# Patient Record
Sex: Female | Born: 1990 | Hispanic: No | Marital: Married | Smoking: Never smoker
Health system: Southern US, Community
[De-identification: ages and names within clinical notes are randomized; demographics above are authoritative.]

---

## 2017-11-06 ENCOUNTER — Other Ambulatory Visit (HOSPITAL_COMMUNITY): Payer: Self-pay | Admitting: Obstetrics & Gynecology

## 2017-11-06 ENCOUNTER — Ambulatory Visit (HOSPITAL_COMMUNITY)
Admission: RE | Admit: 2017-11-06 | Discharge: 2017-11-06 | Disposition: A | Discharge status: Home / Self Care | Payer: 59 | Source: Ambulatory Visit | Attending: Obstetrics & Gynecology | Admitting: Obstetrics & Gynecology

## 2017-11-06 DIAGNOSIS — N83291 Other ovarian cyst, right side: Secondary | ICD-10-CM | POA: Diagnosis not present

## 2017-11-06 DIAGNOSIS — O00119 Unspecified tubal pregnancy with intrauterine pregnancy: Secondary | ICD-10-CM

## 2017-11-06 DIAGNOSIS — N83292 Other ovarian cyst, left side: Secondary | ICD-10-CM | POA: Insufficient documentation

## 2017-11-06 DIAGNOSIS — Z3A01 Less than 8 weeks gestation of pregnancy: Secondary | ICD-10-CM | POA: Insufficient documentation

## 2017-11-06 DIAGNOSIS — O3680X0 Pregnancy with inconclusive fetal viability, not applicable or unspecified: Secondary | ICD-10-CM | POA: Diagnosis present

## 2017-11-06 DIAGNOSIS — O3481 Maternal care for other abnormalities of pelvic organs, first trimester: Secondary | ICD-10-CM | POA: Diagnosis not present

## 2017-12-15 LAB — OB RESULTS CONSOLE ABO/RH: RH Type: POSITIVE

## 2017-12-15 LAB — OB RESULTS CONSOLE RPR: RPR: NONREACTIVE

## 2017-12-15 LAB — OB RESULTS CONSOLE GC/CHLAMYDIA
CHLAMYDIA, DNA PROBE: NEGATIVE
GC PROBE AMP, GENITAL: NEGATIVE

## 2017-12-15 LAB — OB RESULTS CONSOLE RUBELLA ANTIBODY, IGM: Rubella: IMMUNE

## 2017-12-15 LAB — OB RESULTS CONSOLE HIV ANTIBODY (ROUTINE TESTING): HIV: NONREACTIVE

## 2017-12-15 LAB — OB RESULTS CONSOLE ANTIBODY SCREEN: ANTIBODY SCREEN: NEGATIVE

## 2017-12-15 LAB — OB RESULTS CONSOLE HEPATITIS B SURFACE ANTIGEN: HEP B S AG: NEGATIVE

## 2018-06-01 ENCOUNTER — Encounter (HOSPITAL_COMMUNITY): Payer: Self-pay | Admitting: *Deleted

## 2018-06-02 ENCOUNTER — Other Ambulatory Visit: Payer: Self-pay | Admitting: Obstetrics & Gynecology

## 2018-06-07 ENCOUNTER — Encounter (HOSPITAL_COMMUNITY): Payer: Self-pay

## 2018-06-14 NOTE — Patient Instructions (Signed)
Heather Oliver  06/14/2018   Your procedure is scheduled on:  06/21/2018  Enter through the Main Entrance of [facility] at 1130 AM.  Pick up the phone at the desk and dial [phone]  Call this number if you have problems the morning of surgery:[phone]  Remember:   Do not eat food:(After Midnight) Desps de medianoche.  Do not drink clear liquids: (After Midnight) Desps de medianoche.  Take these medicines the morning of surgery with A SIP OF WATER: none   Do not wear jewelry, make-up or nail polish.  Do not wear lotions, powders, or perfumes. Do not wear deodorant.  Do not shave 48 hours prior to surgery.  Do not bring valuables to the hospital.  [facility] is not   responsible for any belongings or valuables brought to the hospital.  Contacts, dentures or bridgework may not be worn into surgery.  Leave suitcase in the car. After surgery it may be brought to your room.  For patients admitted to the hospital, checkout time is 11:00 AM the day of              discharge.    N/A   Please read over the following fact sheets that you were given:   Surgical Site Infection Prevention

## 2018-06-18 ENCOUNTER — Encounter (HOSPITAL_COMMUNITY)
Admission: RE | Admit: 2018-06-18 | Discharge: 2018-06-18 | Disposition: A | Discharge status: Home / Self Care | Payer: 59 | Source: Ambulatory Visit | Attending: Obstetrics & Gynecology | Admitting: Obstetrics & Gynecology

## 2018-06-21 ENCOUNTER — Inpatient Hospital Stay (HOSPITAL_COMMUNITY): Admission: RE | Admit: 2018-06-21 | Payer: 59 | Source: Ambulatory Visit | Admitting: Obstetrics & Gynecology

## 2018-06-21 ENCOUNTER — Encounter (HOSPITAL_COMMUNITY): Admission: RE | Payer: Self-pay | Source: Ambulatory Visit

## 2018-06-21 SURGERY — Surgical Case
Anesthesia: Spinal

## 2018-06-29 ENCOUNTER — Other Ambulatory Visit: Payer: Self-pay | Admitting: Obstetrics and Gynecology

## 2018-06-29 ENCOUNTER — Telehealth (HOSPITAL_COMMUNITY): Payer: Self-pay | Admitting: *Deleted

## 2018-06-29 NOTE — Telephone Encounter (Signed)
Preadmission screen  

## 2018-06-29 NOTE — Patient Instructions (Signed)
Heather Oliver  06/29/2018   Your procedure is scheduled on:  07/01/2018  Enter through the Main Entrance of [facility] at 1100 AM.  Pick up the phone at the desk and dial [phone]  Call this number if you have problems the morning of surgery:[phone]  Remember:   Do not eat food:(After Midnight) Desps de medianoche.  Do not drink clear liquids: (After Midnight) Desps de medianoche.  Take these medicines the morning of surgery with A SIP OF WATER: none   Do not wear jewelry, make-up or nail polish.  Do not wear lotions, powders, or perfumes. Do not wear deodorant.  Do not shave 48 hours prior to surgery.  Do not bring valuables to the hospital.  [facility] is not   responsible for any belongings or valuables brought to the hospital.  Contacts, dentures or bridgework may not be worn into surgery.  Leave suitcase in the car. After surgery it may be brought to your room.  For patients admitted to the hospital, checkout time is 11:00 AM the day of              discharge.    N/A   Please read over the following fact sheets that you were given:   Surgical Site Infection Prevention

## 2018-06-30 ENCOUNTER — Encounter (HOSPITAL_COMMUNITY)
Admission: RE | Admit: 2018-06-30 | Discharge: 2018-06-30 | Disposition: A | Discharge status: Home / Self Care | Payer: 59 | Source: Ambulatory Visit | Attending: Obstetrics and Gynecology | Admitting: Obstetrics and Gynecology

## 2018-06-30 LAB — CBC
HEMATOCRIT: 40.4 % (ref 36.0–46.0)
Hemoglobin: 13 g/dL (ref 12.0–15.0)
MCH: 29 pg (ref 26.0–34.0)
MCHC: 32.2 g/dL (ref 30.0–36.0)
MCV: 90 fL (ref 80.0–100.0)
Platelets: 308 10*3/uL (ref 150–400)
RBC: 4.49 MIL/uL (ref 3.87–5.11)
RDW: 14.7 % (ref 11.5–15.5)
WBC: 10.2 10*3/uL (ref 4.0–10.5)
nRBC: 0 % (ref 0.0–0.2)

## 2018-06-30 LAB — TYPE AND SCREEN
ABO/RH(D): A POS
ANTIBODY SCREEN: NEGATIVE

## 2018-06-30 LAB — ABO/RH: ABO/RH(D): A POS

## 2018-07-01 ENCOUNTER — Encounter (HOSPITAL_COMMUNITY)
Admission: RE | Disposition: A | Discharge status: Home / Self Care | Payer: Self-pay | Source: Home / Self Care | Attending: Obstetrics and Gynecology

## 2018-07-01 ENCOUNTER — Encounter (HOSPITAL_COMMUNITY): Payer: Self-pay | Admitting: *Deleted

## 2018-07-01 ENCOUNTER — Inpatient Hospital Stay (HOSPITAL_COMMUNITY): Payer: 59

## 2018-07-01 ENCOUNTER — Inpatient Hospital Stay (HOSPITAL_COMMUNITY)
Admission: RE | Admit: 2018-07-01 | Discharge: 2018-07-04 | DRG: 788 | Disposition: A | Discharge status: Home / Self Care | Payer: 59 | Attending: Obstetrics and Gynecology | Admitting: Obstetrics and Gynecology

## 2018-07-01 DIAGNOSIS — Z98891 History of uterine scar from previous surgery: Secondary | ICD-10-CM

## 2018-07-01 DIAGNOSIS — O34211 Maternal care for low transverse scar from previous cesarean delivery: Principal | ICD-10-CM | POA: Diagnosis present

## 2018-07-01 DIAGNOSIS — Z3A40 40 weeks gestation of pregnancy: Secondary | ICD-10-CM

## 2018-07-01 LAB — RPR: RPR Ser Ql: NONREACTIVE

## 2018-07-01 SURGERY — Surgical Case
Anesthesia: Spinal | Site: Abdomen | Wound class: Clean Contaminated

## 2018-07-01 MED ORDER — EPHEDRINE 5 MG/ML INJ
INTRAVENOUS | Status: AC
  Filled 2018-07-01: qty 10

## 2018-07-01 MED ORDER — OXYCODONE HCL 5 MG PO TABS: 5.0000 mg | ORAL_TABLET | Freq: Once | ORAL | Status: DC | PRN

## 2018-07-01 MED ORDER — OXYTOCIN 10 UNIT/ML IJ SOLN
INTRAVENOUS | Status: DC | PRN
  Administered 2018-07-01: 40 [IU] via INTRAVENOUS

## 2018-07-01 MED ORDER — NALBUPHINE HCL 10 MG/ML IJ SOLN: 5.0000 mg | Freq: Once | INTRAMUSCULAR | Status: DC | PRN

## 2018-07-01 MED ORDER — PHENYLEPHRINE 40 MCG/ML (10ML) SYRINGE FOR IV PUSH (FOR BLOOD PRESSURE SUPPORT)
PREFILLED_SYRINGE | INTRAVENOUS | Status: AC
  Filled 2018-07-01: qty 10

## 2018-07-01 MED ORDER — BUPIVACAINE IN DEXTROSE 0.75-8.25 % IT SOLN
INTRATHECAL | Status: DC | PRN
  Administered 2018-07-01: 1.6 mL via INTRATHECAL

## 2018-07-01 MED ORDER — SOD CITRATE-CITRIC ACID 500-334 MG/5ML PO SOLN: 30.0000 mL | Freq: Once | ORAL | Status: DC

## 2018-07-01 MED ORDER — ONDANSETRON HCL 4 MG/2ML IJ SOLN: 4.0000 mg | Freq: Once | INTRAMUSCULAR | Status: DC | PRN

## 2018-07-01 MED ORDER — OXYCODONE-ACETAMINOPHEN 5-325 MG PO TABS
2.0000 | ORAL_TABLET | ORAL | Status: DC | PRN
  Administered 2018-07-02 – 2018-07-03 (×3): 2 via ORAL
  Filled 2018-07-01 (×4): qty 2

## 2018-07-01 MED ORDER — SIMETHICONE 80 MG PO CHEW: 80.0000 mg | CHEWABLE_TABLET | ORAL | Status: DC | PRN

## 2018-07-01 MED ORDER — NALOXONE HCL 0.4 MG/ML IJ SOLN: 0.4000 mg | INTRAMUSCULAR | Status: DC | PRN

## 2018-07-01 MED ORDER — PHENYLEPHRINE 8 MG IN D5W 100 ML (0.08MG/ML) PREMIX OPTIME
INJECTION | INTRAVENOUS | Status: AC
  Filled 2018-07-01: qty 100

## 2018-07-01 MED ORDER — DEXAMETHASONE SODIUM PHOSPHATE 4 MG/ML IJ SOLN
INTRAMUSCULAR | Status: AC
  Filled 2018-07-01: qty 1

## 2018-07-01 MED ORDER — DIPHENHYDRAMINE HCL 25 MG PO CAPS
25.0000 mg | ORAL_CAPSULE | ORAL | Status: DC | PRN
  Filled 2018-07-01: qty 1

## 2018-07-01 MED ORDER — FENTANYL CITRATE (PF) 100 MCG/2ML IJ SOLN
25.0000 ug | INTRAMUSCULAR | Status: DC | PRN
  Administered 2018-07-01 (×2): 50 ug via INTRAVENOUS

## 2018-07-01 MED ORDER — CEFAZOLIN SODIUM-DEXTROSE 2-4 GM/100ML-% IV SOLN
2.0000 g | INTRAVENOUS | Status: AC
  Administered 2018-07-01: 2 g via INTRAVENOUS
  Filled 2018-07-01: qty 100

## 2018-07-01 MED ORDER — PHENYLEPHRINE 40 MCG/ML (10ML) SYRINGE FOR IV PUSH (FOR BLOOD PRESSURE SUPPORT)
PREFILLED_SYRINGE | INTRAVENOUS | Status: DC | PRN
  Administered 2018-07-01: 120 ug via INTRAVENOUS
  Administered 2018-07-01: 80 ug via INTRAVENOUS

## 2018-07-01 MED ORDER — SODIUM CHLORIDE 0.9 % IR SOLN
Status: DC | PRN
  Administered 2018-07-01: 1

## 2018-07-01 MED ORDER — COCONUT OIL OIL
1.0000 "application " | TOPICAL_OIL | Status: DC | PRN
  Filled 2018-07-01: qty 120

## 2018-07-01 MED ORDER — FENTANYL CITRATE (PF) 100 MCG/2ML IJ SOLN
INTRAMUSCULAR | Status: AC
  Filled 2018-07-01: qty 2

## 2018-07-01 MED ORDER — ONDANSETRON HCL 4 MG/2ML IJ SOLN: 4.0000 mg | Freq: Three times a day (TID) | INTRAMUSCULAR | Status: DC | PRN

## 2018-07-01 MED ORDER — SODIUM CHLORIDE 0.9% FLUSH: 3.0000 mL | INTRAVENOUS | Status: DC | PRN

## 2018-07-01 MED ORDER — OXYTOCIN 40 UNITS IN LACTATED RINGERS INFUSION - SIMPLE MED: 2.5000 [IU]/h | INTRAVENOUS | Status: DC

## 2018-07-01 MED ORDER — SIMETHICONE 80 MG PO CHEW
80.0000 mg | CHEWABLE_TABLET | ORAL | Status: DC
  Administered 2018-07-01 – 2018-07-03 (×3): 80 mg via ORAL
  Filled 2018-07-01 (×3): qty 1

## 2018-07-01 MED ORDER — DIBUCAINE 1 % RE OINT: 1.0000 "application " | TOPICAL_OINTMENT | RECTAL | Status: DC | PRN

## 2018-07-01 MED ORDER — PRENATAL MULTIVITAMIN CH
1.0000 | ORAL_TABLET | Freq: Every day | ORAL | Status: DC
  Administered 2018-07-02 – 2018-07-04 (×3): 1 via ORAL
  Filled 2018-07-01 (×3): qty 1

## 2018-07-01 MED ORDER — NALBUPHINE HCL 10 MG/ML IJ SOLN: 5.0000 mg | INTRAMUSCULAR | Status: DC | PRN

## 2018-07-01 MED ORDER — LACTATED RINGERS IV SOLN
INTRAVENOUS | Status: DC
  Administered 2018-07-01 (×4): via INTRAVENOUS

## 2018-07-01 MED ORDER — FENTANYL CITRATE (PF) 100 MCG/2ML IJ SOLN
INTRAMUSCULAR | Status: DC | PRN
  Administered 2018-07-01: 15 ug via INTRATHECAL

## 2018-07-01 MED ORDER — WITCH HAZEL-GLYCERIN EX PADS: 1.0000 "application " | MEDICATED_PAD | CUTANEOUS | Status: DC | PRN

## 2018-07-01 MED ORDER — PHENYLEPHRINE 8 MG IN D5W 100 ML (0.08MG/ML) PREMIX OPTIME
INJECTION | INTRAVENOUS | Status: DC | PRN
  Administered 2018-07-01: 60 ug/min via INTRAVENOUS

## 2018-07-01 MED ORDER — MORPHINE SULFATE (PF) 0.5 MG/ML IJ SOLN
INTRAMUSCULAR | Status: AC
  Filled 2018-07-01: qty 10

## 2018-07-01 MED ORDER — MORPHINE SULFATE (PF) 0.5 MG/ML IJ SOLN
INTRAMUSCULAR | Status: DC | PRN
  Administered 2018-07-01: .15 mg via INTRATHECAL

## 2018-07-01 MED ORDER — EPHEDRINE SULFATE-NACL 50-0.9 MG/10ML-% IV SOSY
PREFILLED_SYRINGE | INTRAVENOUS | Status: DC | PRN
  Administered 2018-07-01 (×2): 5 mg via INTRAVENOUS

## 2018-07-01 MED ORDER — DIPHENHYDRAMINE HCL 50 MG/ML IJ SOLN: 12.5000 mg | INTRAMUSCULAR | Status: DC | PRN

## 2018-07-01 MED ORDER — LACTATED RINGERS IV SOLN: INTRAVENOUS | Status: DC

## 2018-07-01 MED ORDER — ONDANSETRON HCL 4 MG/2ML IJ SOLN
INTRAMUSCULAR | Status: DC | PRN
  Administered 2018-07-01: 4 mg via INTRAVENOUS

## 2018-07-01 MED ORDER — DEXAMETHASONE SODIUM PHOSPHATE 4 MG/ML IJ SOLN
INTRAMUSCULAR | Status: DC | PRN
  Administered 2018-07-01: 10 mg via INTRAVENOUS

## 2018-07-01 MED ORDER — ACETAMINOPHEN 325 MG PO TABS
650.0000 mg | ORAL_TABLET | ORAL | Status: DC | PRN
  Administered 2018-07-01 – 2018-07-03 (×3): 650 mg via ORAL
  Filled 2018-07-01 (×3): qty 2

## 2018-07-01 MED ORDER — OXYCODONE HCL 5 MG/5ML PO SOLN: 5.0000 mg | Freq: Once | ORAL | Status: DC | PRN

## 2018-07-01 MED ORDER — SCOPOLAMINE 1 MG/3DAYS TD PT72: 1.0000 | MEDICATED_PATCH | Freq: Once | TRANSDERMAL | Status: DC

## 2018-07-01 MED ORDER — MEPERIDINE HCL 25 MG/ML IJ SOLN: 6.2500 mg | INTRAMUSCULAR | Status: DC | PRN

## 2018-07-01 MED ORDER — SOD CITRATE-CITRIC ACID 500-334 MG/5ML PO SOLN
ORAL | Status: AC
  Administered 2018-07-01: 30 mL
  Filled 2018-07-01: qty 15

## 2018-07-01 MED ORDER — IBUPROFEN 600 MG PO TABS
600.0000 mg | ORAL_TABLET | Freq: Four times a day (QID) | ORAL | Status: DC
  Administered 2018-07-01 – 2018-07-03 (×6): 600 mg via ORAL
  Filled 2018-07-01 (×6): qty 1

## 2018-07-01 MED ORDER — SIMETHICONE 80 MG PO CHEW
80.0000 mg | CHEWABLE_TABLET | Freq: Three times a day (TID) | ORAL | Status: DC
  Administered 2018-07-02 – 2018-07-04 (×6): 80 mg via ORAL
  Filled 2018-07-01 (×6): qty 1

## 2018-07-01 MED ORDER — NALOXONE HCL 4 MG/10ML IJ SOLN
1.0000 ug/kg/h | INTRAVENOUS | Status: DC | PRN
  Filled 2018-07-01: qty 5

## 2018-07-01 MED ORDER — ONDANSETRON HCL 4 MG/2ML IJ SOLN
INTRAMUSCULAR | Status: AC
  Filled 2018-07-01: qty 2

## 2018-07-01 MED ORDER — LACTATED RINGERS IV SOLN
INTRAVENOUS | Status: DC | PRN
  Administered 2018-07-01: 15:00:00 via INTRAVENOUS

## 2018-07-01 MED ORDER — SENNOSIDES-DOCUSATE SODIUM 8.6-50 MG PO TABS
2.0000 | ORAL_TABLET | ORAL | Status: DC
  Administered 2018-07-01 – 2018-07-03 (×3): 2 via ORAL
  Filled 2018-07-01 (×3): qty 2

## 2018-07-01 MED ORDER — OXYCODONE-ACETAMINOPHEN 5-325 MG PO TABS
1.0000 | ORAL_TABLET | ORAL | Status: DC | PRN
  Administered 2018-07-02 – 2018-07-04 (×4): 1 via ORAL
  Filled 2018-07-01 (×4): qty 1

## 2018-07-01 MED ORDER — TETANUS-DIPHTH-ACELL PERTUSSIS 5-2.5-18.5 LF-MCG/0.5 IM SUSP: 0.5000 mL | Freq: Once | INTRAMUSCULAR | Status: DC

## 2018-07-01 MED ORDER — DIPHENHYDRAMINE HCL 25 MG PO CAPS: 25.0000 mg | ORAL_CAPSULE | Freq: Four times a day (QID) | ORAL | Status: DC | PRN

## 2018-07-01 MED ORDER — MENTHOL 3 MG MT LOZG: 1.0000 | LOZENGE | OROMUCOSAL | Status: DC | PRN

## 2018-07-01 SURGICAL SUPPLY — 38 items
BENZOIN TINCTURE PRP APPL 2/3 (GAUZE/BANDAGES/DRESSINGS) ×3 IMPLANT
CHLORAPREP W/TINT 26ML (MISCELLANEOUS) ×3 IMPLANT
CLAMP CORD UMBIL (MISCELLANEOUS) IMPLANT
CLOSURE STERI STRIP 1/2 X4 (GAUZE/BANDAGES/DRESSINGS) ×3 IMPLANT
CLOSURE WOUND 1/2 X4 (GAUZE/BANDAGES/DRESSINGS)
CLOTH BEACON ORANGE TIMEOUT ST (SAFETY) ×3 IMPLANT
DRSG OPSITE POSTOP 4X10 (GAUZE/BANDAGES/DRESSINGS) ×3 IMPLANT
ELECT REM PT RETURN 9FT ADLT (ELECTROSURGICAL) ×3
ELECTRODE REM PT RTRN 9FT ADLT (ELECTROSURGICAL) ×1 IMPLANT
EXTRACTOR VACUUM KIWI (MISCELLANEOUS) ×3 IMPLANT
GLOVE BIOGEL PI IND STRL 6.5 (GLOVE) ×1 IMPLANT
GLOVE BIOGEL PI IND STRL 7.0 (GLOVE) ×2 IMPLANT
GLOVE BIOGEL PI INDICATOR 6.5 (GLOVE) ×2
GLOVE BIOGEL PI INDICATOR 7.0 (GLOVE) ×4
GLOVE ECLIPSE 6.5 STRL STRAW (GLOVE) ×3 IMPLANT
GOWN STRL REUS W/TWL LRG LVL3 (GOWN DISPOSABLE) ×6 IMPLANT
KIT ABG SYR 3ML LUER SLIP (SYRINGE) IMPLANT
NEEDLE HYPO 25X5/8 SAFETYGLIDE (NEEDLE) IMPLANT
NS IRRIG 1000ML POUR BTL (IV SOLUTION) ×3 IMPLANT
PACK C SECTION WH (CUSTOM PROCEDURE TRAY) ×3 IMPLANT
PAD ABD 7.5X8 STRL (GAUZE/BANDAGES/DRESSINGS) ×6 IMPLANT
PAD OB MATERNITY 4.3X12.25 (PERSONAL CARE ITEMS) ×3 IMPLANT
PENCIL SMOKE EVAC W/HOLSTER (ELECTROSURGICAL) ×3 IMPLANT
SPONGE LAP 18X18 RF (DISPOSABLE) ×15 IMPLANT
STRIP CLOSURE SKIN 1/2X4 (GAUZE/BANDAGES/DRESSINGS) IMPLANT
SUT MNCRL 0 VIOLET CTX 36 (SUTURE) ×3 IMPLANT
SUT MONOCRYL 0 CTX 36 (SUTURE) ×6
SUT PDS AB 0 CTX 36 PDP370T (SUTURE) IMPLANT
SUT PLAIN 2 0 XLH (SUTURE) IMPLANT
SUT VIC AB 0 CT1 36 (SUTURE) ×6 IMPLANT
SUT VIC AB 2-0 CT1 27 (SUTURE) ×2
SUT VIC AB 2-0 CT1 TAPERPNT 27 (SUTURE) ×1 IMPLANT
SUT VIC AB 3-0 CT1 27 (SUTURE) ×2
SUT VIC AB 3-0 CT1 TAPERPNT 27 (SUTURE) ×1 IMPLANT
SUT VIC AB 4-0 KS 27 (SUTURE) ×3 IMPLANT
SUT VIC AB 4-0 PS2 27 (SUTURE) ×3 IMPLANT
TOWEL OR 17X24 6PK STRL BLUE (TOWEL DISPOSABLE) ×3 IMPLANT
TRAY FOLEY W/BAG SLVR 14FR LF (SET/KITS/TRAYS/PACK) IMPLANT

## 2018-07-01 NOTE — Anesthesia Procedure Notes (Signed)
Spinal  Patient location during procedure: OR Staffing Anesthesiologist: Timberly Yott E, MD Performed: anesthesiologist  Preanesthetic Checklist Completed: patient identified, surgical consent, pre-op evaluation, timeout performed, IV checked, risks and benefits discussed and monitors and equipment checked Spinal Block Patient position: sitting Prep: site prepped and draped and DuraPrep Patient monitoring: continuous pulse ox, blood pressure and heart rate Approach: midline Location: L3-4 Injection technique: single-shot Needle Needle type: Pencan  Needle gauge: 24 G Needle length: 9 cm Additional Notes Functioning IV was confirmed and monitors were applied. Sterile prep and drape, including hand hygiene and sterile gloves were used. The patient was positioned and the spine was prepped. The skin was anesthetized with lidocaine.  Free flow of clear CSF was obtained prior to injecting local anesthetic into the CSF. The needle was carefully withdrawn. The patient tolerated the procedure well.      

## 2018-07-01 NOTE — Anesthesia Postprocedure Evaluation (Signed)
Anesthesia Post Note  Patient: Heather Oliver  Procedure(s) Performed: Repeat CESAREAN SECTION (N/A Abdomen)     Patient location during evaluation: PACU Anesthesia Type: Spinal Level of consciousness: oriented and awake and alert Pain management: pain level controlled Vital Signs Assessment: post-procedure vital signs reviewed and stable Respiratory status: spontaneous breathing and respiratory function stable Cardiovascular status: blood pressure returned to baseline and stable Postop Assessment: no headache, no backache, no apparent nausea or vomiting and spinal receding Anesthetic complications: no    Last Vitals:  Vitals:   07/01/18 1630 07/01/18 1645  BP: (!) 87/57 (!) 96/42  Pulse: (!) 103 98  Resp: 20 19  Temp:    SpO2: 98% 100%    Last Pain:  Vitals:   07/01/18 1653  TempSrc:   PainSc: 4    Pain Goal:                 [name]

## 2018-07-01 NOTE — Op Note (Addendum)
Cesarean Section Procedure Note   Heather Oliver  07/01/2018  Indications: h/o LTCS, no longer desires TOLAC, desires RLTCS   Pre-operative Diagnosis: Previous Cesarean Section, desires repeat, term iup.   Post-operative Diagnosis: Same   Surgeon: Surgeon(s) and Role:    * Justin Buechner, Candace Gallus, MD - Primary   Assistants: Webb Silversmith  Anesthesia: spinal   Procedure Details:  The patient was seen in the Holding Room. The risks, benefits, complications, treatment options, and expected outcomes were discussed with the patient. The patient concurred with the proposed plan, giving informed consent. identified as Heather Oliver and the procedure verified as C-Section Delivery. A Time Out was held and the above information confirmed.  After induction of anesthesia, the patient was draped and prepped in the usual sterile manner. A transverse was made and carried down through the subcutaneous tissue to the fascia - extensive scarring of fascia noted, and suture noted at fascia with scarring around - a running suture of what appears to be PDS.  The suture was in the left aspect of the fascial incision and the scalpel was used to clear the scarring and then the suture was cut and removed.  The right aspect of the fascial incision was clear of suture.  The suture appeared to be 2-3cm below this aspect of the fascial incision and was hidden in scarring. Fascial incision was made with the scalpel and extended transversely. The fascia was separated from the underlying rectus tissue superiorly and inferiorly. Hemostasis with the bovie.  The peritoneum was identified and entered bluntly. Peritoneal incision was extended longitudinally. The utero-vesical peritoneal reflection was incised transversely and the bladder flap was bluntly freed from the lower uterine segment. A low transverse uterine incision was made. Delivered from cephalic presentation was a viable female infant .  Cord ph was not sent the umbilical cord was clamped and cut cord blood was obtained for evaluation. The placenta was removed Intact and spontaneously, and appeared normal.  The uterus was then exteriorized and cleared of all clot and debris.  The uterine outline, tubes and ovaries appeared normal}. The uterine incision was closed with running locked sutures of 0 monocryl. An embrocating layer was then done with 0-monocryl.  Hemostasis was observed. The gutters were then cleared of clot with moist lap.  The uterus was then placed back into the abdomen and good hemostasis was noted. The abdomen was explored and good hemostasis noted.  The fascia was then reapproximated with running sutures of 0Vicryl. The subcutaneous layer was then irrigated.  The skin was closed with 4-0vicryl.   Instrument, sponge, and needle counts were correct prior the abdominal closure and were correct at the conclusion of the case.    Findings: significant scarring of fascia and some scarring of peritoneum, suture noted along midline to left apex of incision at fascia - scarring around the suture; normal appearing tubes/ovaries bilat, nml appearing uterus; viable female infant, apgars 9/9   Estimated Blood Loss: 500 mL   Total IV Fluids:   Urine Output: 300CC OF clear urine  Specimens: none  Complications: no complications  Disposition: PACU - hemodynamically stable.   Maternal Condition: stable   Baby condition / location:  Couplet care / Skin to Skin  Attending Attestation: I was present and scrubbed for the entire procedure.   Signed: Surgeon(s): Amado Nash Candace Gallus, MD

## 2018-07-01 NOTE — H&P (Signed)
Heather Oliver December Hedtke is a 27 y.o. G2P1 at [redacted]w[redacted]d gestation presents for repeat c/s. She denies any vaginal bleeding, lof, ctx.  Does note +FM.  H/o ltcs and desires repeat  Antepartum course: uncomplicated  [facility] at [facility] since 12 wks.  See complete pre-natal records  History OB History    Gravida  2   Para  1   Term      Preterm      AB      Living  1     SAB      TAB      Ectopic      Multiple      Live Births  1          History reviewed. No pertinent past medical history. Past Surgical History:  Procedure Laterality Date  . CESAREAN SECTION     Family History: family history includes Drug abuse in her maternal grandfather and paternal grandfather. Social History:  reports that she has never smoked. She has never used smokeless tobacco. She reports that she does not drink alcohol or use drugs.  ROS: See above otherwise negative  Prenatal labs:  ABO, Rh: --/--/A POS, A POS Performed at [facility], [address] [place], [place] [address]  [phone] 1132) Antibody: NEG (11/06 1132) Rubella: Immune (04/23 0000) RPR: Non Reactive (11/06 1132)  HBsAg: Negative (04/23 0000)  HIV:Non-reactive (04/23 0000)  GBS:    1 hr Glucola: failed 1 hr gtt, passed 3 hr gtt Genetic screening: Normal Anatomy US: Normal  Physical Exam:     Blood pressure 117/74, pulse 80, temperature 97.6 F (36.4 C), temperature source Oral, resp. rate 18, height 5\' 5"  (1.651 m), weight 70.8 kg. A&O x 3 HEENT: Normal Lungs: CTAB CV: RRR Abdominal: Soft, Non-tender and Gravid Lower Extremities: Non-edematous, Non-tender  Pelvic Exam: Deferred  FHT: category 1 Toco: rare ctx  Labs:  CBC:  Lab Results  Component Value Date   WBC 10.2 06/30/2018   RBC 4.49 06/30/2018   HGB 13.0 06/30/2018   HCT 40.4 06/30/2018   MCV 90.0 06/30/2018   MCH 29.0 06/30/2018   MCHC 32.2 06/30/2018   RDW 14.7 06/30/2018   PLT 308 06/30/2018   CMP: No results  found for: NA, K, CL, CO2, GLUCOSE, BUN, CREATININE, CALCIUM, PROT, AST, ALT, ALBUMIN, ALKPHOS, BILITOT, GFRNONAA, GFRAA, ANIONGAP Urine: No results found for: COLORURINE, APPEARANCEUR, LABSPEC, PHURINE, GLUCOSEU, HGBUR, BILIRUBINUR, KETONESUR, PROTEINUR, NITRITE, LEUKOCYTESUR   Prenatal Transfer Tool  Maternal Diabetes: No Genetic Screening: Normal Maternal Ultrasounds/Referrals: Normal Fetal Ultrasounds or other Referrals:  None Maternal Substance Abuse:  No Significant Maternal Medications:  None Significant Maternal Lab Results: Lab values include: Group B Strep negative    Assessment/Plan:  27 y.o. G2P1 at [redacted]w[redacted]d gestation with h/o ltcs, wants rltcs  1. Proceed to OR - aware of risk of bleeding, infection, injury to bowel, bladder, nerves, blood vessels, risk of further surgery, risk of blood clot, risk of anesthesia; pt asking about bc options  - pt has had iud in past, do not recommend permanent procedure now as this was not her plan and permanent; discussed btl/iud with pt and she would like iud and discuss further pp 2. gbs neg 3. Fetal status reassuring   Vick Frees 07/01/2018, 1:27 PM

## 2018-07-01 NOTE — Progress Notes (Signed)
Triton QBL was notes as 233 ml

## 2018-07-01 NOTE — Lactation Note (Addendum)
This note was copied from a baby's chart. Lactation Consultation Note  Patient Name: Heather Oliver [id number]'[id number] Date: 07/01/2018 Reason for consult: Initial assessment;Term P2, 7 hour female infant, c/s delivery. Per mom, BF eldest son for 3 years. Per mom, has DEBP at home. Per mom infant had 1 void and 1 stool since delivery. LC entered room infant laying in basinet spitting up emesis was clear and frothy. Per mom, she wanted help with hand expression, it has  been while and she is unsure if she is  doing it  correctly. Mom easily expressed 1 ml of clear colostrum that was spoon feed to infant. Infant was still BF as LC left room, infant had been BF for 10 minutes. Mom latched infant to left breast using the football hold, infant open mouth with side gape and audible swallowing observed with deep latch.  Infant will take most of mom's areola, Mom has everted door knob shaped nipples. Mom will BF according hunger cues 8 to 12 times within 24 hours including nights. LC discussed I & O. Reviewed Baby & Me book's Breastfeeding Basics. Mom made aware of O/P services, breastfeeding support groups, community resources, and our phone # for post-discharge questions.    Maternal Data Formula Feeding for Exclusion: No Has patient been taught Hand Expression?: Yes(Mom demostrated hand expression with LC 1ml of colostrum given to infant on spoon.) Does the patient have breastfeeding experience prior to this delivery?: Yes  Feeding    LATCH Score Latch: Grasps breast easily, tongue down, lips flanged, rhythmical sucking.  Audible Swallowing: Spontaneous and intermittent  Type of Nipple: Everted at rest and after stimulation  Comfort (Breast/Nipple): Soft / non-tender  Hold (Positioning): Assistance needed to correctly position infant at breast and maintain latch.  LATCH Score: 9  Interventions Interventions: Breast feeding basics reviewed;Breast compression;Assisted with latch;Breast  massage;Hand express;Position options;Support pillows;Adjust position  Lactation Tools Discussed/Used WIC Program: No   Consult Status Consult Status: Follow-up Date: 07/02/18 Follow-up type: In-patient    Danelle Earthly 07/01/2018, 10:29 PM

## 2018-07-01 NOTE — Transfer of Care (Signed)
Immediate Anesthesia Transfer of Care Note  Patient: Heather Oliver  Procedure(s) Performed: Repeat CESAREAN SECTION (N/A Abdomen)  Patient Location: PACU  Anesthesia Type:Spinal  Level of Consciousness: awake, alert  and patient cooperative  Airway & Oxygen Therapy: Patient Spontanous Breathing  Post-op Assessment: Report given to RN and Post -op Vital signs reviewed and stable  Post vital signs: Reviewed and stable  Last Vitals:  Vitals Value Taken Time  BP 98/57 07/01/2018  3:53 PM  Temp 36.4 C 07/01/2018  3:53 PM  Pulse 96 07/01/2018  3:53 PM  Resp 17 07/01/2018  3:53 PM  SpO2 100 % 07/01/2018  3:53 PM    Last Pain:  Vitals:   07/01/18 1553  TempSrc: Oral  PainSc:          Complications: No apparent anesthesia complications

## 2018-07-01 NOTE — Anesthesia Preprocedure Evaluation (Signed)
Anesthesia Evaluation  Patient identified by MRN, date of birth, ID band Patient awake    Reviewed: Allergy & Precautions, H&P , NPO status , Patient's Chart, lab work & pertinent test results  History of Anesthesia Complications Negative for: history of anesthetic complications  Airway Mallampati: II  TM Distance: >3 FB Neck ROM: full    Dental no notable dental hx.    Pulmonary neg pulmonary ROS,    Pulmonary exam normal        Cardiovascular negative cardio ROS Normal cardiovascular exam     Neuro/Psych negative neurological ROS  negative psych ROS   GI/Hepatic negative GI ROS, Neg liver ROS,   Endo/Other  negative endocrine ROS  Renal/GU negative Renal ROS  negative genitourinary   Musculoskeletal   Abdominal   Peds  Hematology negative hematology ROS (+)   Anesthesia Other Findings   Reproductive/Obstetrics (+) Pregnancy                             Anesthesia Physical Anesthesia Plan  ASA: II  Anesthesia Plan: Spinal   Post-op Pain Management:    Induction:   PONV Risk Score and Plan: Ondansetron and Treatment may vary due to age or medical condition  Airway Management Planned:   Additional Equipment:   Intra-op Plan:   Post-operative Plan:   Informed Consent: I have reviewed the patients History and Physical, chart, labs and discussed the procedure including the risks, benefits and alternatives for the proposed anesthesia with the patient or authorized representative who has indicated his/her understanding and acceptance.       Plan Discussed with:   Anesthesia Plan Comments:         Anesthesia Quick Evaluation  

## 2018-07-02 LAB — CBC
HCT: 34.6 % — ABNORMAL LOW (ref 36.0–46.0)
Hemoglobin: 11.2 g/dL — ABNORMAL LOW (ref 12.0–15.0)
MCH: 28.6 pg (ref 26.0–34.0)
MCHC: 32.4 g/dL (ref 30.0–36.0)
MCV: 88.5 fL (ref 80.0–100.0)
NRBC: 0 % (ref 0.0–0.2)
PLATELETS: 284 10*3/uL (ref 150–400)
RBC: 3.91 MIL/uL (ref 3.87–5.11)
RDW: 14.7 % (ref 11.5–15.5)
WBC: 16.1 10*3/uL — ABNORMAL HIGH (ref 4.0–10.5)

## 2018-07-02 NOTE — Progress Notes (Signed)
RN entered room to complete focused reassessment, get orthostatic vital signs on patient, and attempt to walk patient in room. Upon taking orthostatic vital signs the patient's blood pressure was fluctuating and she reported feeling "very dizzy and a little lightheaded". RN got patient back to bed and patient reported feeling"better". Will reassess orthostatic vitals and ability to stand and walk in room.

## 2018-07-02 NOTE — Anesthesia Postprocedure Evaluation (Signed)
Anesthesia Post Note  Patient: Heather Oliver  Procedure(s) Performed: Repeat CESAREAN SECTION (N/A Abdomen)     Patient location during evaluation: Mother Baby Anesthesia Type: Spinal Level of consciousness: oriented and awake and alert Pain management: pain level controlled Vital Signs Assessment: post-procedure vital signs reviewed and stable Respiratory status: spontaneous breathing and respiratory function stable Cardiovascular status: stable Postop Assessment: no headache, no backache, no apparent nausea or vomiting, patient able to bend at knees, adequate PO intake, able to ambulate and spinal receding Anesthetic complications: no    Last Vitals:  Vitals:   07/02/18 0300 07/02/18 0648  BP:  92/62  Pulse:  72  Resp: 17 18  Temp: 36.9 C 37.1 C  SpO2: 99% 99%    Last Pain:  Vitals:   07/02/18 0648  TempSrc: Oral  PainSc: 5    Pain Goal: Patients Stated Pain Goal: 3 (07/01/18 1840)               Laban Emperor

## 2018-07-02 NOTE — Progress Notes (Signed)
POSTOPERATIVE DAY # 1 S/P R-CS  S:         Reports feeling ok             Tolerating po intake / no nausea / no vomiting / no flatus / no BM             Bleeding is light             Pain controlled with Long-acting narcotic             Up ad lib / ambulatory/ voiding QS  Newborn Breast   O:  VS: BP 92/62 (BP Location: Right Arm)   Pulse 72   Temp 98.7 F (37.1 C) (Oral)   Resp 18   Ht 5\' 5"  (1.651 m)   Wt 70.8 kg   SpO2 99%   Breastfeeding? Unknown   BMI 25.99 kg/m    LABS:              Recent Labs    06/30/18 1132 07/02/18 0519  WBC 10.2 16.1*  HGB 13.0 11.2*  PLT 308 284               Bloodtype: --/--/A POS, A POS Performed at [facility], [address] [place], [place] [address]  [phone]/[phone])  Rubella: Immune (04/23 0000)               Flu and tdap - Current 2019                                      I&O: Intake/Output      11/07 0701 - 11/08 0700 11/08 0701 - 11/09 0700   I.V. (mL/kg) 2472.9 (34.9)    Total Intake(mL/kg) 2472.9 (34.9)    Urine (mL/kg/hr) 1550    Blood 500    Total Output 2050    Net +422.9                    Physical Exam:             Alert and Oriented X3  Lungs: Clear and unlabored  Heart: regular rate and rhythm / no mumurs  Abdomen: soft, non-tender, non-distended              Fundus: firm, non-tender, U-1             Dressing pressure dressing intact without drainage    Lochia: light  Extremities: no edema, no calf pain or tenderness, SCD in place  A:        POD # 1 S/P R-CS             P:        Routine postoperative care              Advance post-op care - anticipate DC tomorrow    Marlinda Mike CNM, MSN, [facility] 07/02/2018, 9:02 AM

## 2018-07-02 NOTE — Addendum Note (Signed)
Addendum  created 07/02/18 0818 by Elgie Congo, CRNA   Sign clinical note

## 2018-07-03 MED ORDER — MAGNESIUM OXIDE 400 (241.3 MG) MG PO TABS
400.0000 mg | ORAL_TABLET | Freq: Every day | ORAL | Status: AC
  Administered 2018-07-03 – 2018-07-04 (×2): 400 mg via ORAL
  Filled 2018-07-03 (×2): qty 1

## 2018-07-03 MED ORDER — IBUPROFEN 800 MG PO TABS
800.0000 mg | ORAL_TABLET | Freq: Four times a day (QID) | ORAL | Status: DC
  Administered 2018-07-03 – 2018-07-04 (×5): 800 mg via ORAL
  Filled 2018-07-03 (×5): qty 1

## 2018-07-03 MED ORDER — BISACODYL 10 MG RE SUPP
10.0000 mg | Freq: Once | RECTAL | Status: AC
  Administered 2018-07-04: 10 mg via RECTAL
  Filled 2018-07-03: qty 1

## 2018-07-03 NOTE — Lactation Note (Signed)
This note was copied from a baby's chart. Lactation Consultation Note  Patient Name: Heather Oliver [id number] Date: 07/03/2018 Reason for consult: Follow-up assessment;Term;Infant weight loss;Engorgement  23 hours old FT female who is being exclusively BF by his mother, she's a P2. RN called because mom was getting engorged. Mom already pumping when entering the room, refilled both of her ice packs, and instructed her to re-apply for 20 minutes prior pumping. Mom able to get 7 ml of colostrum through a 20 minutes pumping session. Showed mom how to use the 15 minute setting in her pump, also brought 2 basins, soap and patient belonging bag for storage and cleaning of her pump parts.  When trying to do breast massage and finger tapping mom voiced she was in pain, LC stopped, but showed mom how to do it when she's done pumping. RN aware of mom's status and that she's going to need ice packs every so often.   Feeding plan:  1. Mom will apply ice packs for 20 minutes at the time prior pumping. 2. She'll try pumping for 15 minutes every 3 hours to empty her breasts, applying ice packs 20 minutes prior pumping 3. She'll try hand expression or pumping prior latching baby to the breast to let the milk flow first 4. Mom has coconut oil in her room to use prior pumping. She's using the # 27 flanges, she states they're working well for her 5. She'll use her EBM first and Similac 19 calorie formula afterwards only to complete volumes required for supplementation  Mom reported all questions and concerns were answered, she's aware of LC services and will call PRN.  Maternal Data    Feeding Feeding Type: Bottle Fed - Formula  Interventions Interventions: Breast feeding basics reviewed;Breast massage;DEBP;Breast compression;Ice;Coconut oil  Lactation Tools Discussed/Used Tools: Pump;Coconut oil Breast pump type: Double-Electric Breast Pump Pump Review: Setup, frequency, and cleaning;Milk  Storage Initiated by:: RN Date initiated:: 07/03/18   Consult Status Consult Status: Follow-up Date: 07/04/18 Follow-up type: In-patient    Kyria Bumgardner Venetia Constable 07/03/2018, 4:27 PM

## 2018-07-03 NOTE — Progress Notes (Signed)
POSTOPERATIVE DAY # 2 S/P CS-R  S:         Reports feeling sore today - "much more pain"             Tolerating po intake / no nausea / no vomiting / no flatus / no BM             Bleeding is light             Pain controlled with motrin and oxycodone             Up ad lib / ambulatory/ voiding QS  Newborn Bottle during night with formula  O:  VS: BP 97/63 (BP Location: Left Arm)   Pulse 71   Temp 97.6 F (36.4 C) (Oral)   Resp 18   Ht 5\' 5"  (1.651 m)   Wt 70.8 kg   SpO2 100%   Breastfeeding? Unknown   BMI 25.99 kg/m    LABS:              Recent Labs    06/30/18 1132 07/02/18 0519  WBC 10.2 16.1*  HGB 13.0 11.2*  PLT 308 284               Bloodtype: --/--/A POS, A POS Performed at [facility], [address] [place], [place] [address]  [phone]/06 1132)  Rubella: Immune (04/23 0000)                 tdap and flu current                                       I&O: Intake/Output      11/08 0701 - 11/09 0700 11/09 0701 - 11/10 0700   I.V. (mL/kg)     Total Intake(mL/kg)     Urine (mL/kg/hr) 2100 (1.2)    Blood     Total Output 2100    Net -2100                    Physical Exam:             Alert and Oriented X3  Lungs: Clear and unlabored  Heart: regular rate and rhythm / no mumurs  Abdomen: soft, non-tender, mildly distended, active BS             Fundus: firm, non-tender, U-1             Dressing intact pressure dressing          Lochia: scant  Extremities: no edema, no calf pain or tenderness  A:        POD # 2 S/P CS            Post-op pain related to gas pains  P:        Routine postoperative care              Increase Motrin dose             Out of bed - ambulate - shower and remove pressure dressing / warm fluids              Dulcolax suppository this am with dose magnesium oxide to promote bowel motility    Marlinda Mike CNM, MSN, [facility] 07/03/2018, 9:12 AM

## 2018-07-04 MED ORDER — OXYCODONE-ACETAMINOPHEN 5-325 MG PO TABS: 1.0000 | ORAL_TABLET | ORAL | 0 refills | Status: AC | PRN

## 2018-07-04 MED ORDER — ACETAMINOPHEN 325 MG PO TABS: 650.0000 mg | ORAL_TABLET | ORAL | Status: AC | PRN

## 2018-07-04 MED ORDER — IBUPROFEN 800 MG PO TABS: 800.0000 mg | ORAL_TABLET | Freq: Four times a day (QID) | ORAL | 0 refills | Status: AC

## 2018-07-04 MED ORDER — SIMETHICONE 80 MG PO CHEW: 80.0000 mg | CHEWABLE_TABLET | ORAL | 0 refills | Status: AC | PRN

## 2018-07-04 MED ORDER — SENNOSIDES-DOCUSATE SODIUM 8.6-50 MG PO TABS: 2.0000 | ORAL_TABLET | ORAL | Status: AC

## 2018-07-04 NOTE — Discharge Summary (Signed)
OB Discharge Summary  Patient Name: Heather Oliver DOB: 01-31-1991 MRN: [id number]  Date of admission: 07/01/2018 Delivering provider: Rhoderick Moody E   Date of discharge: 07/04/2018  Admitting diagnosis: Previous Cesarean Section Intrauterine pregnancy: [redacted]w[redacted]d     Secondary diagnosis:Principal Problem:   Postpartum care following cesarean delivery (11/7) Active Problems:   Previous cesarean section     Discharge diagnosis:  Patient Active Problem List   Diagnosis Date Noted  . Postpartum care following cesarean delivery (11/7) 07/02/2018  . Previous cesarean section 07/01/2018   Pain control: Spinal  Complications: None  Hospital course:  Sceduled C/S   27 y.o. yo G2P1002 at [redacted]w[redacted]d was admitted to the hospital 07/01/2018 for scheduled cesarean section with the following indication:Elective Repeat.  Membrane Rupture Time/Date: 2:52 PM ,07/01/2018   Patient delivered a Viable infant.07/01/2018  Details of operation can be found in separate operative note.  Pateint had an uncomplicated postpartum course.  She is ambulating, tolerating a regular diet, passing flatus, and urinating well. Patient is discharged home in stable condition on  07/04/18         Physical exam  Vitals:   07/03/18 0506 07/03/18 1445 07/03/18 2219 07/04/18 0533  BP: 97/63 (!) 102/58 105/60 98/61  Pulse: 71 71 67 68  Resp: 18 19 18 18   Temp: 97.6 F (36.4 C) 98 F (36.7 C) 97.8 F (36.6 C) 98 F (36.7 C)  TempSrc: Oral Oral Oral Oral  SpO2: 100%     Weight:      Height:       General: alert, cooperative and no distress Lochia: appropriate Uterine Fundus: firm Incision: Healing well with no significant drainage DVT Evaluation: No cords or calf tenderness. No significant calf/ankle edema. Labs: Lab Results  Component Value Date   WBC 16.1 (H) 07/02/2018   HGB 11.2 (L) 07/02/2018   HCT 34.6 (L) 07/02/2018   MCV 88.5 07/02/2018   PLT 284 07/02/2018   No flowsheet data  found.  Discharge instruction: per After Visit Summary and "Baby and Me Booklet".  After Visit Meds:  Allergies as of 07/04/2018   No Known Allergies     Medication List    TAKE these medications   acetaminophen 325 MG tablet Commonly known as:  TYLENOL Take 2 tablets (650 mg total) by mouth every 4 (four) hours as needed (for pain scale < 4).   ibuprofen 800 MG tablet Commonly known as:  ADVIL,MOTRIN Take 1 tablet (800 mg total) by mouth every 6 (six) hours.   oxyCODONE-acetaminophen 5-325 MG tablet Commonly known as:  PERCOCET/ROXICET Take 1 tablet by mouth every 4 (four) hours as needed (pain scale 4-7).   prenatal multivitamin Tabs tablet Take 1 tablet by mouth daily at 12 noon.   senna-docusate 8.6-50 MG tablet Commonly known as:  Senokot-S Take 2 tablets by mouth daily. Start taking on:  07/05/2018   simethicone 80 MG chewable tablet Commonly known as:  MYLICON Chew 1 tablet (80 mg total) by mouth as needed for flatulence.       Diet: routine diet  Activity: Advance as tolerated. Pelvic rest for 6 weeks.   Postpartum contraception: Not Discussed  Newborn Data: Live born female  Birth Weight: 8 lb 3.4 oz (3725 g) APGAR: 9, 9  Newborn Delivery   Birth date/time:  07/01/2018 14:53:00 Delivery type:  C-Section, Low Transverse Trial of labor:  No C-section categorization:  Repeat     named Aadvik Baby Feeding: Breast Disposition:home with mother   Delivery  Report:  Review the Delivery Report for details.    Follow up: Follow-up Information    Shea Evans, MD. Schedule an appointment as soon as possible for a visit in 16 week(s).   Specialty:  Obstetrics and Gynecology Contact information: [address] [place] [address] [phone]             Signed: Cipriano Mile, MSN 07/04/2018, 10:19 AM

## 2018-07-04 NOTE — Lactation Note (Signed)
This note was copied from a baby's chart. Lactation Consultation Note  Patient Name: Heather Oliver [name] Date: 07/04/2018 Reason for consult: Follow-up assessment Engorgement resolving.  Breasts full but soft and milk is flowing well. Mom c/o breasts feel warm.  Reassured and recommended she continue ice packs for comfort.  She recently pumped 60 mls. Baby is receiving breast milk per bottle. Baby is currently on the breast with a shallow latch.  Mom was able to bring baby closer to breast and latch improved.  Instructed to use breast massage during feeding.  Lactation outpatient services and support reviewed and encouraged prn.  Maternal Data    Feeding Feeding Type: Bottle Fed - Formula  LATCH Score                   Interventions    Lactation Tools Discussed/Used     Consult Status Consult Status: Complete Follow-up type: Call as needed    Huston Foley 07/04/2018, 9:24 AM

## 2018-08-15 IMAGING — US US OB < 14 WEEKS - US OB TV
1 series · 15 of 28 positions shown · non-contrast
Comparison: None.

CLINICAL DATA: Unplanned pregnancy. Possible ectopic pregnancy on
office ultrasound. Gestational age by LMP of 6 weeks 0 days.

EXAM:
OBSTETRIC <14 WK US AND TRANSVAGINAL OB US
TECHNIQUE: Both transabdominal and transvaginal ultrasound examinations were
performed for complete evaluation of the gestation as well as the
maternal uterus, adnexal regions, and pelvic cul-de-sac.
Transvaginal technique was performed to assess early pregnancy.

[Series 1: us ob < 14 weeks - us ob tv · 106 acquisitions, 15 frames shown]
[im 1/106]
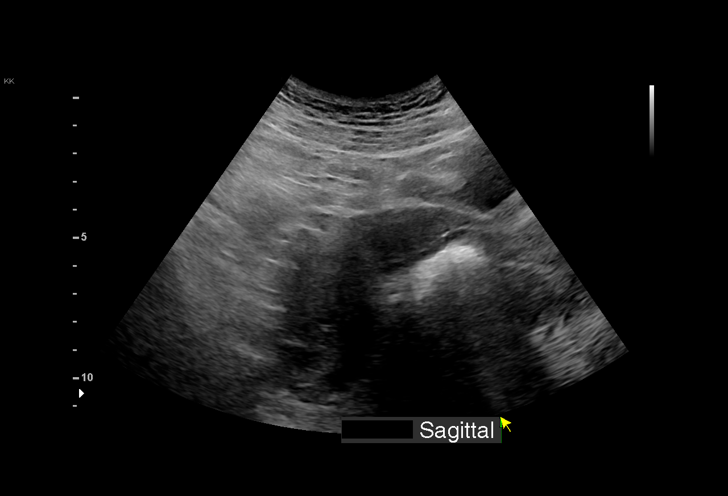
[im 8/106]
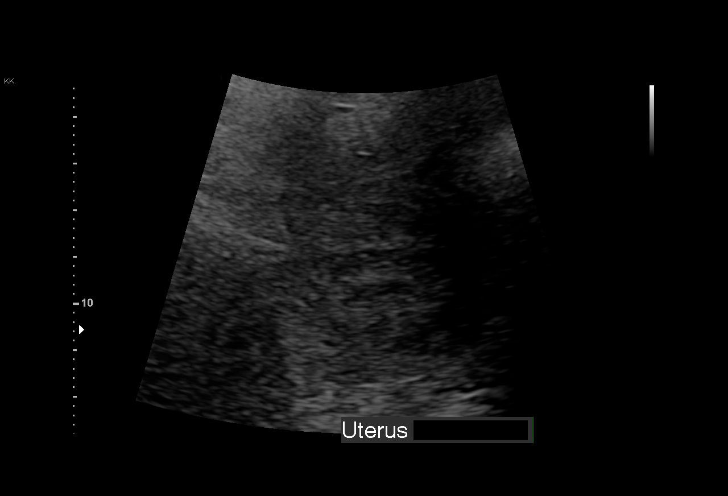
[im 16/106]
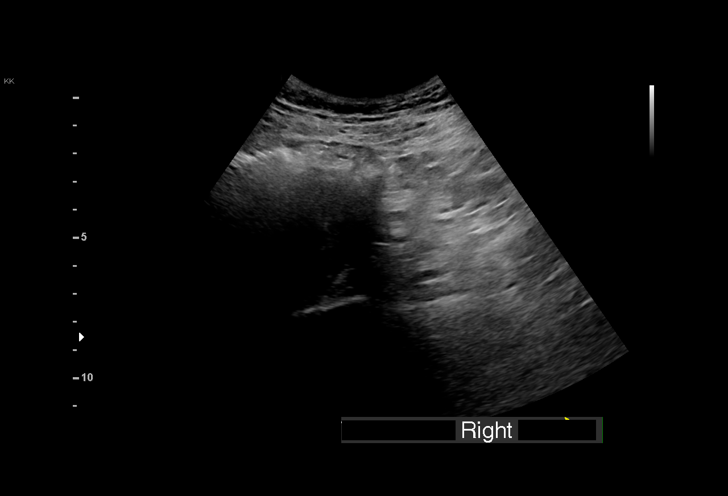
[im 24/106]
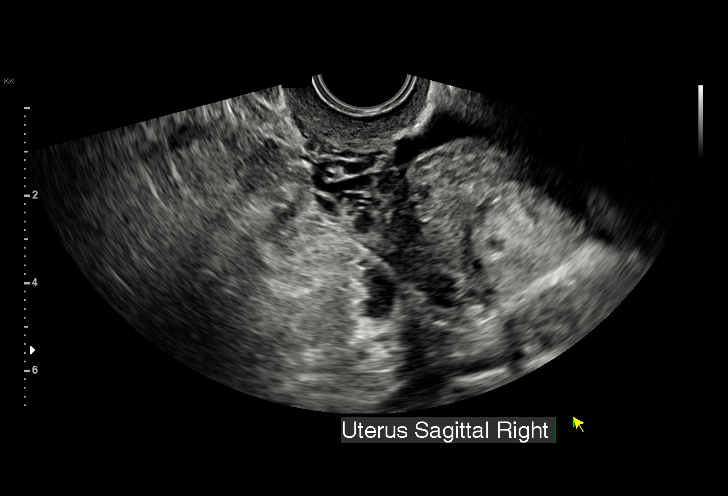
[im 32/106]
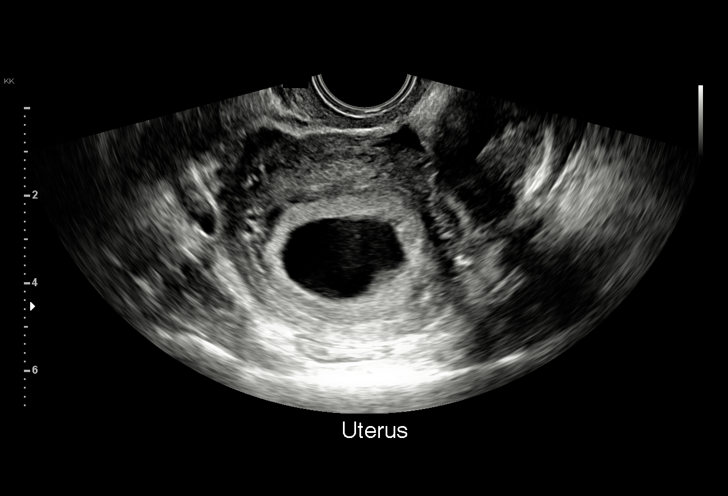
[im 39/106]
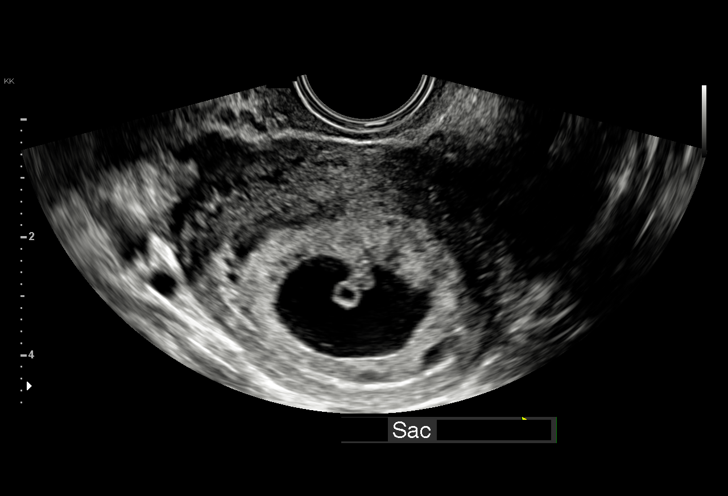
[im 47/106]
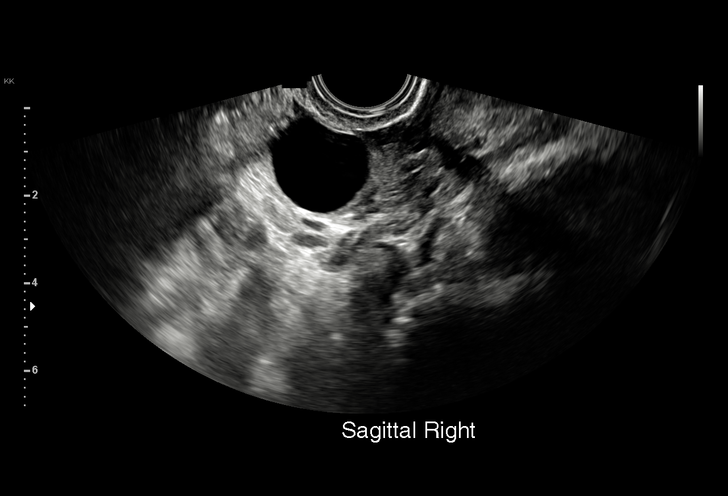
[im 55/106]
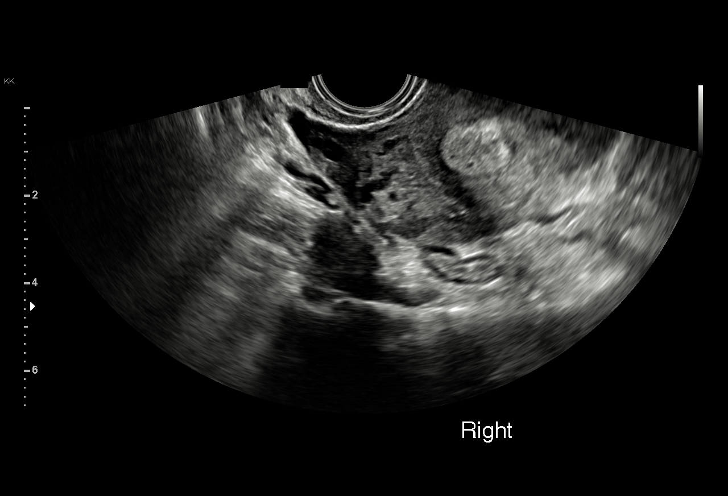
[im 59/106]
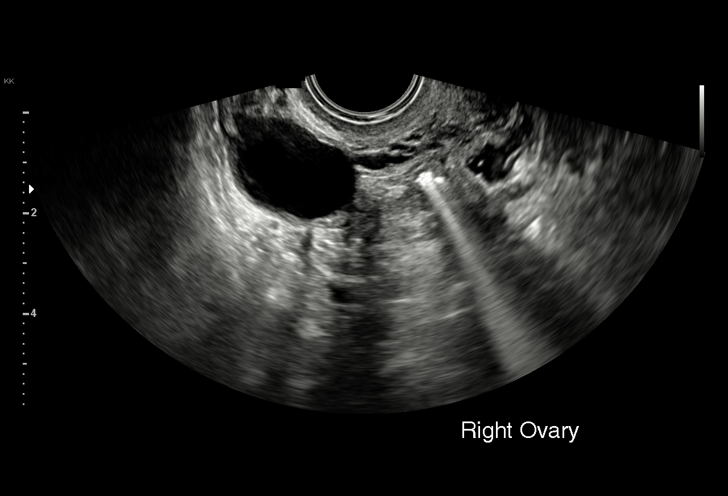
[im 67/106]
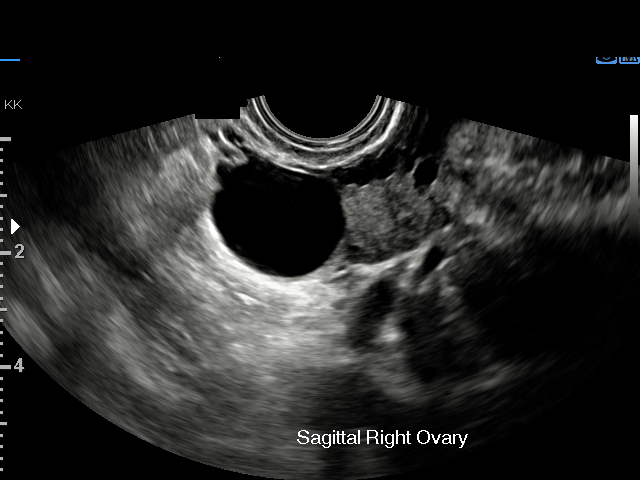
[im 74/106]
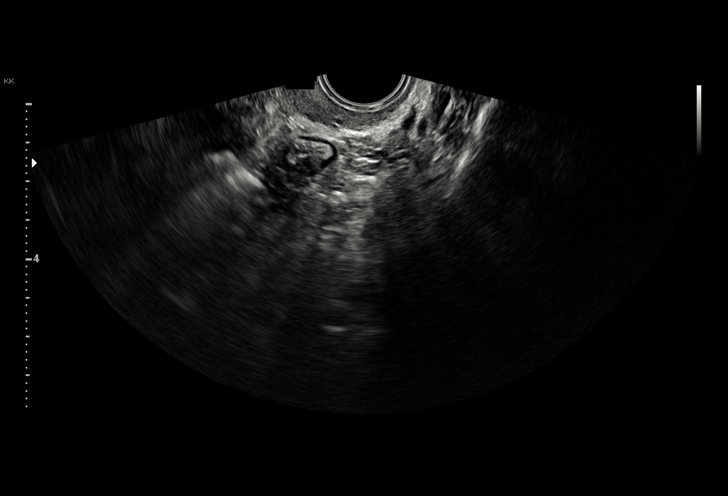
[im 82/106]
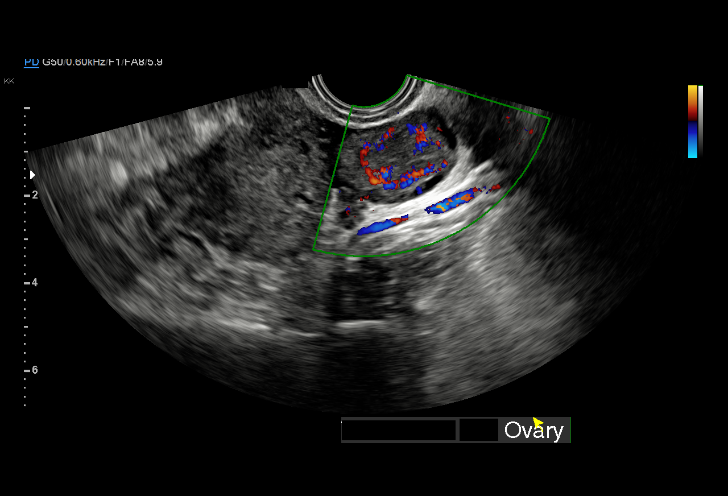
[im 90/106]
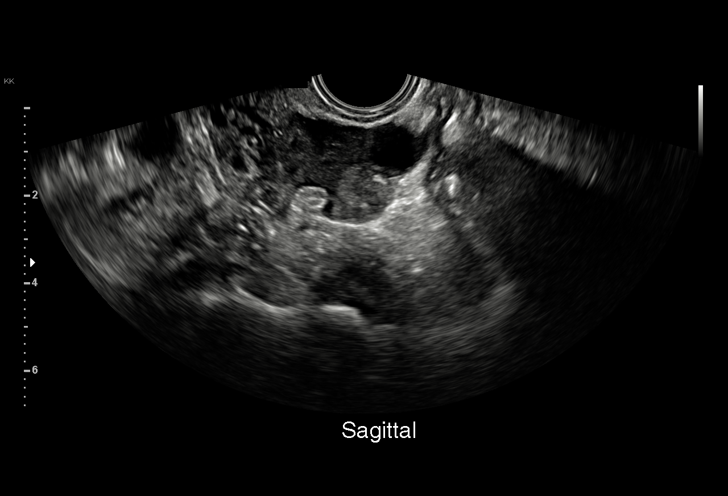
[im 98/106]
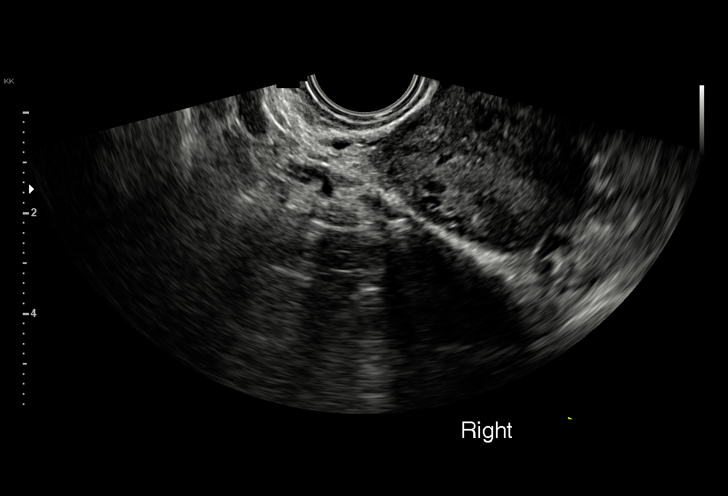
[im 106/106]
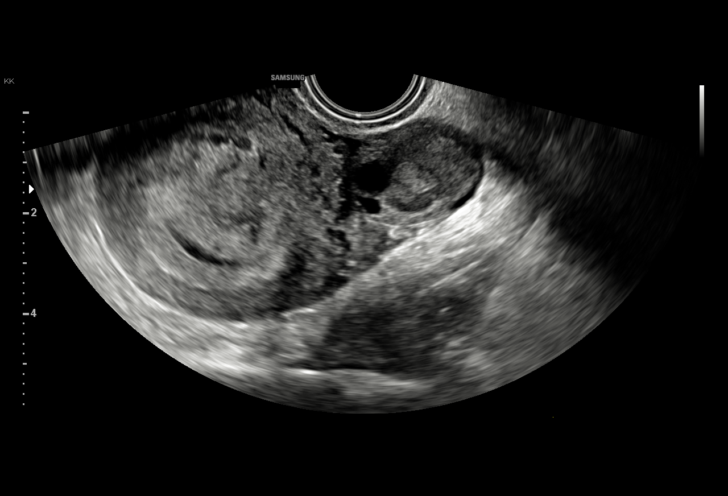

[15 of 28 positions shown; findings below may reference images not displayed]

FINDINGS: Intrauterine gestational sac: Single

Yolk sac:  Visualized.

Embryo:  Visualized.

Cardiac Activity: Visualized.

Heart Rate: 117 bpm

CRL:  6 mm   6 w   2 d                  US EDC: 06/30/2018

Subchorionic hemorrhage:  None visualized.

Maternal uterus/adnexae: Retroverted uterus. Small left ovarian
corpus luteum noted. Small bilateral benign-appearing ovarian or
paraovarian cysts also seen, measuring 2.7 cm on the right and
cm on the left. No suspicious adnexal masses or free fluid
identified.
IMPRESSION: Single living IUP measuring 6 weeks 2 days. This is concordant with
LMP.

Small bilateral benign-appearing ovarian or paraovarian cysts. No
suspicious adnexal mass or free fluid.
